# Patient Record
Sex: Female | Born: 1960 | Race: White | Hispanic: No | Smoking: Never smoker
Health system: Southern US, Community
[De-identification: ages and names within clinical notes are randomized; demographics above are authoritative.]

## PROBLEM LIST (undated history)

## (undated) DIAGNOSIS — N189 Chronic kidney disease, unspecified: Secondary | ICD-10-CM

## (undated) DIAGNOSIS — A6000 Herpesviral infection of urogenital system, unspecified: Secondary | ICD-10-CM

## (undated) DIAGNOSIS — C801 Malignant (primary) neoplasm, unspecified: Secondary | ICD-10-CM

## (undated) DIAGNOSIS — Z9289 Personal history of other medical treatment: Secondary | ICD-10-CM

## (undated) HISTORY — DX: Herpesviral infection of urogenital system, unspecified: A60.00

## (undated) HISTORY — PX: SKIN CANCER EXCISION: SHX779

## (undated) HISTORY — PX: BREAST BIOPSY: SHX20

## (undated) HISTORY — DX: Personal history of other medical treatment: Z92.89

## (undated) HISTORY — PX: NASAL FRACTURE SURGERY: SHX718

## (undated) HISTORY — DX: Chronic kidney disease, unspecified: N18.9

## (undated) HISTORY — DX: Malignant (primary) neoplasm, unspecified: C80.1

## (undated) HISTORY — PX: WISDOM TOOTH EXTRACTION: SHX21

## (undated) HISTORY — PX: COLONOSCOPY: SHX174

---

## 1999-04-28 ENCOUNTER — Other Ambulatory Visit: Admission: RE | Admit: 1999-04-28 | Discharge: 1999-04-28 | Payer: Self-pay | Admitting: Obstetrics and Gynecology

## 2001-05-15 ENCOUNTER — Other Ambulatory Visit: Admission: RE | Admit: 2001-05-15 | Discharge: 2001-05-15 | Payer: Self-pay | Admitting: Obstetrics and Gynecology

## 2002-05-21 ENCOUNTER — Other Ambulatory Visit: Admission: RE | Admit: 2002-05-21 | Discharge: 2002-05-21 | Payer: Self-pay | Admitting: Obstetrics and Gynecology

## 2003-05-27 ENCOUNTER — Other Ambulatory Visit: Admission: RE | Admit: 2003-05-27 | Discharge: 2003-05-27 | Payer: Self-pay | Admitting: Obstetrics and Gynecology

## 2004-07-06 ENCOUNTER — Other Ambulatory Visit: Admission: RE | Admit: 2004-07-06 | Discharge: 2004-07-06 | Payer: Self-pay | Admitting: Obstetrics and Gynecology

## 2005-11-25 ENCOUNTER — Encounter: Admission: RE | Admit: 2005-11-25 | Discharge: 2005-11-25 | Payer: Self-pay | Admitting: Family Medicine

## 2007-01-18 ENCOUNTER — Encounter: Admission: RE | Admit: 2007-01-18 | Discharge: 2007-01-18 | Payer: Self-pay | Admitting: Obstetrics and Gynecology

## 2008-06-17 ENCOUNTER — Ambulatory Visit: Payer: Self-pay | Admitting: Gastroenterology

## 2008-06-17 DIAGNOSIS — K625 Hemorrhage of anus and rectum: Secondary | ICD-10-CM

## 2008-06-19 ENCOUNTER — Ambulatory Visit: Payer: Self-pay | Admitting: Gastroenterology

## 2008-06-26 ENCOUNTER — Encounter: Admission: RE | Admit: 2008-06-26 | Discharge: 2008-06-26 | Payer: Self-pay | Admitting: Obstetrics and Gynecology

## 2008-07-02 ENCOUNTER — Encounter: Admission: RE | Admit: 2008-07-02 | Discharge: 2008-07-02 | Payer: Self-pay | Admitting: Obstetrics and Gynecology

## 2009-01-05 ENCOUNTER — Encounter: Admission: RE | Admit: 2009-01-05 | Discharge: 2009-01-05 | Payer: Self-pay | Admitting: Obstetrics and Gynecology

## 2009-07-23 IMAGING — MG MM SCREEN MAMMOGRAM BILATERAL
4 series · 4 of 4 positions shown · non-contrast
Comparison: none

DG SCREEN MAMMOGRAM BILATERAL
Bilateral CC and MLO view(s) were taken.

DIGITAL SCREENING MAMMOGRAM WITH CAD:
There are scattered fibroglandular densities.  Microcalcifications are present in the right breast.
Characterization with magnification views is recommended.  No mass or malignant type 
calcifications are identified in the left breast.  Compared with prior studies.

[R CC]
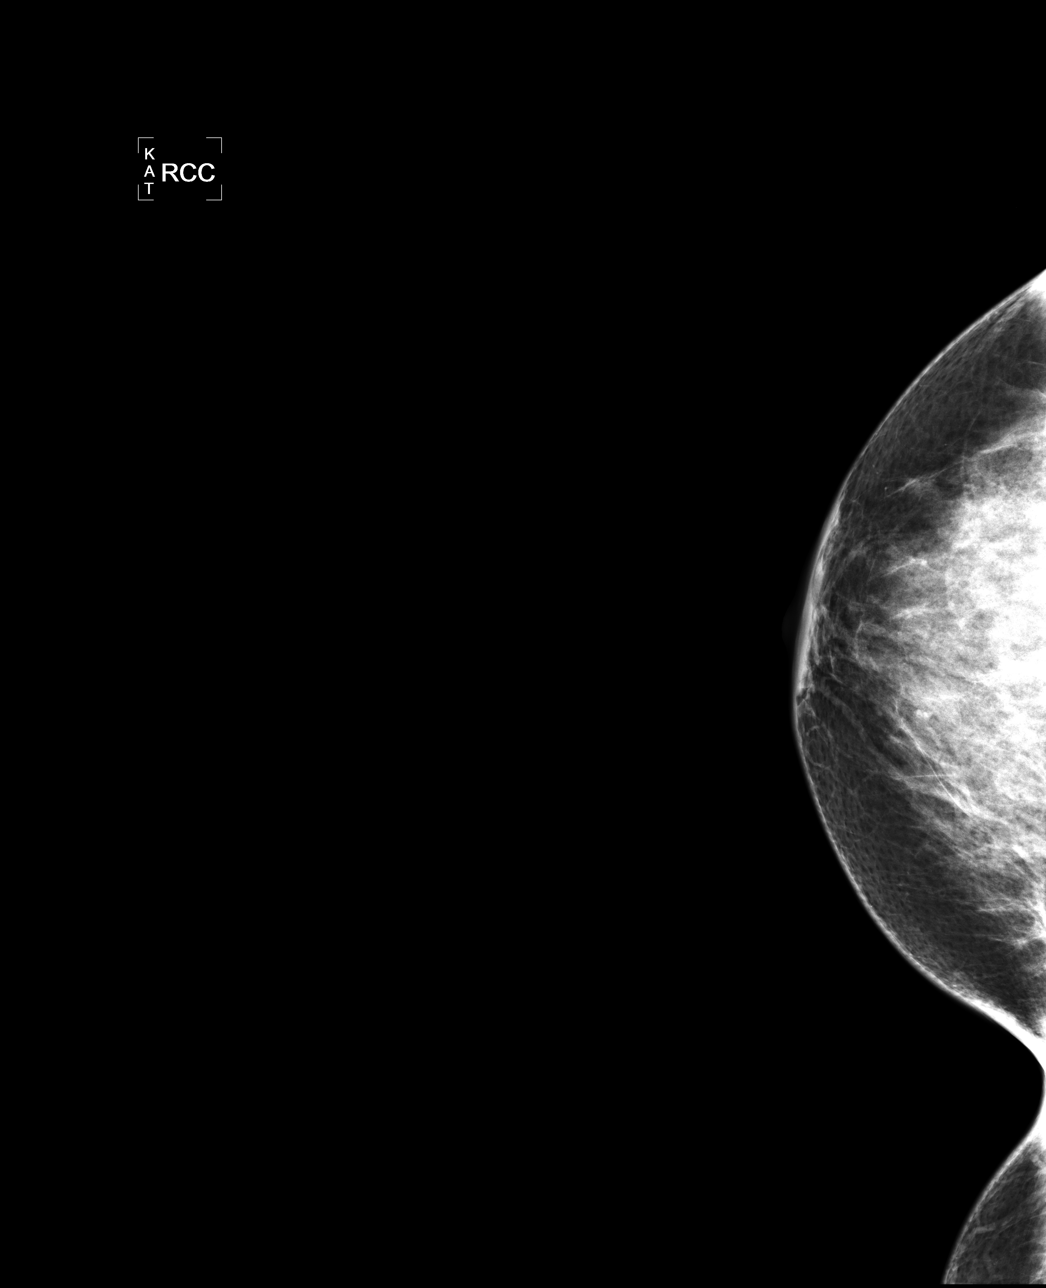

[L CC]
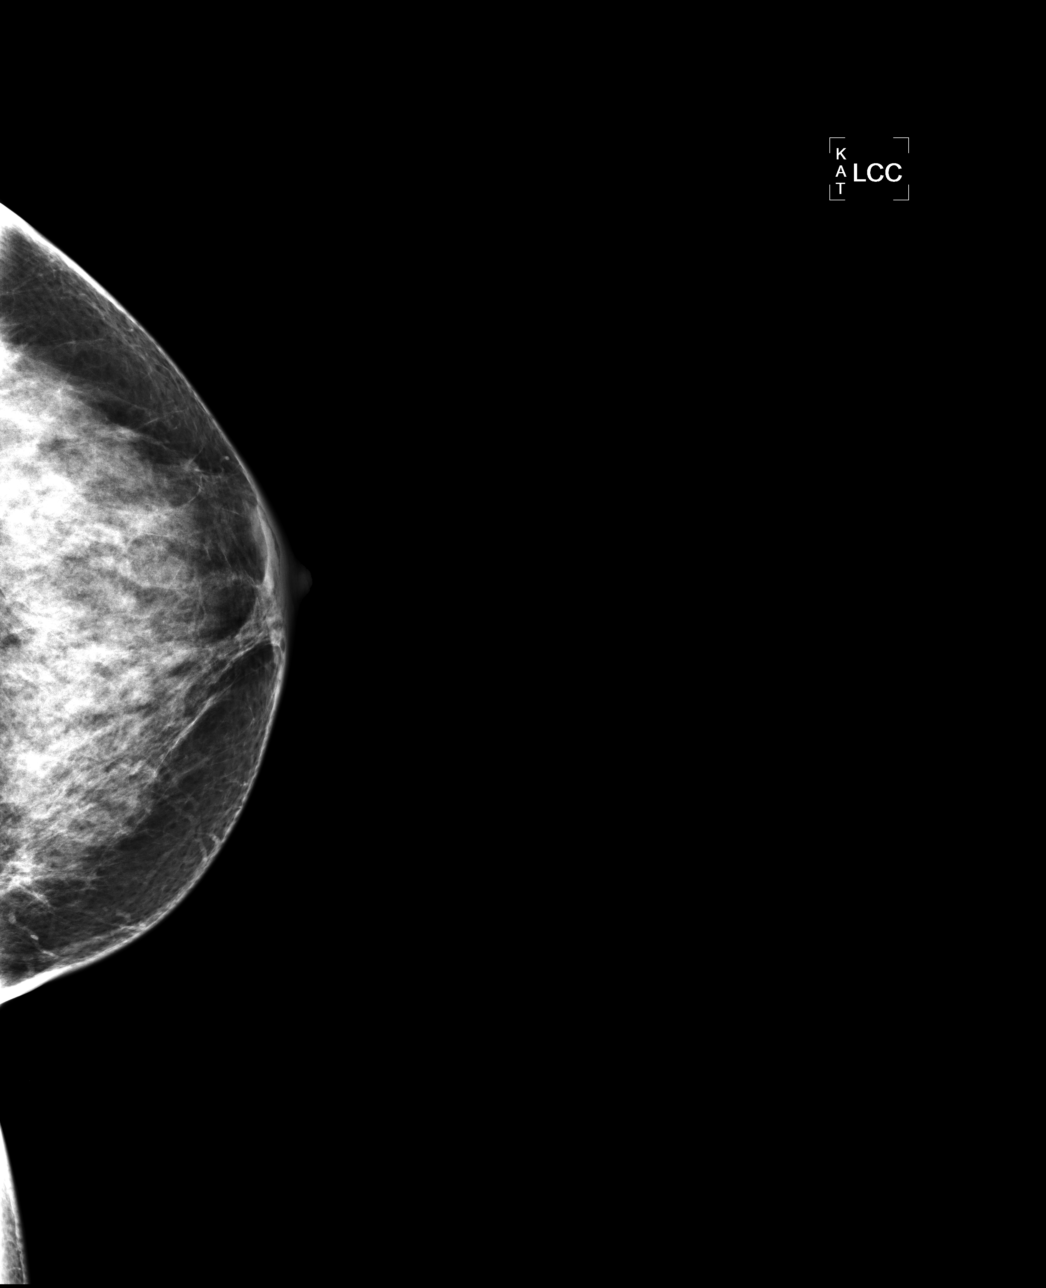

[L MLO]
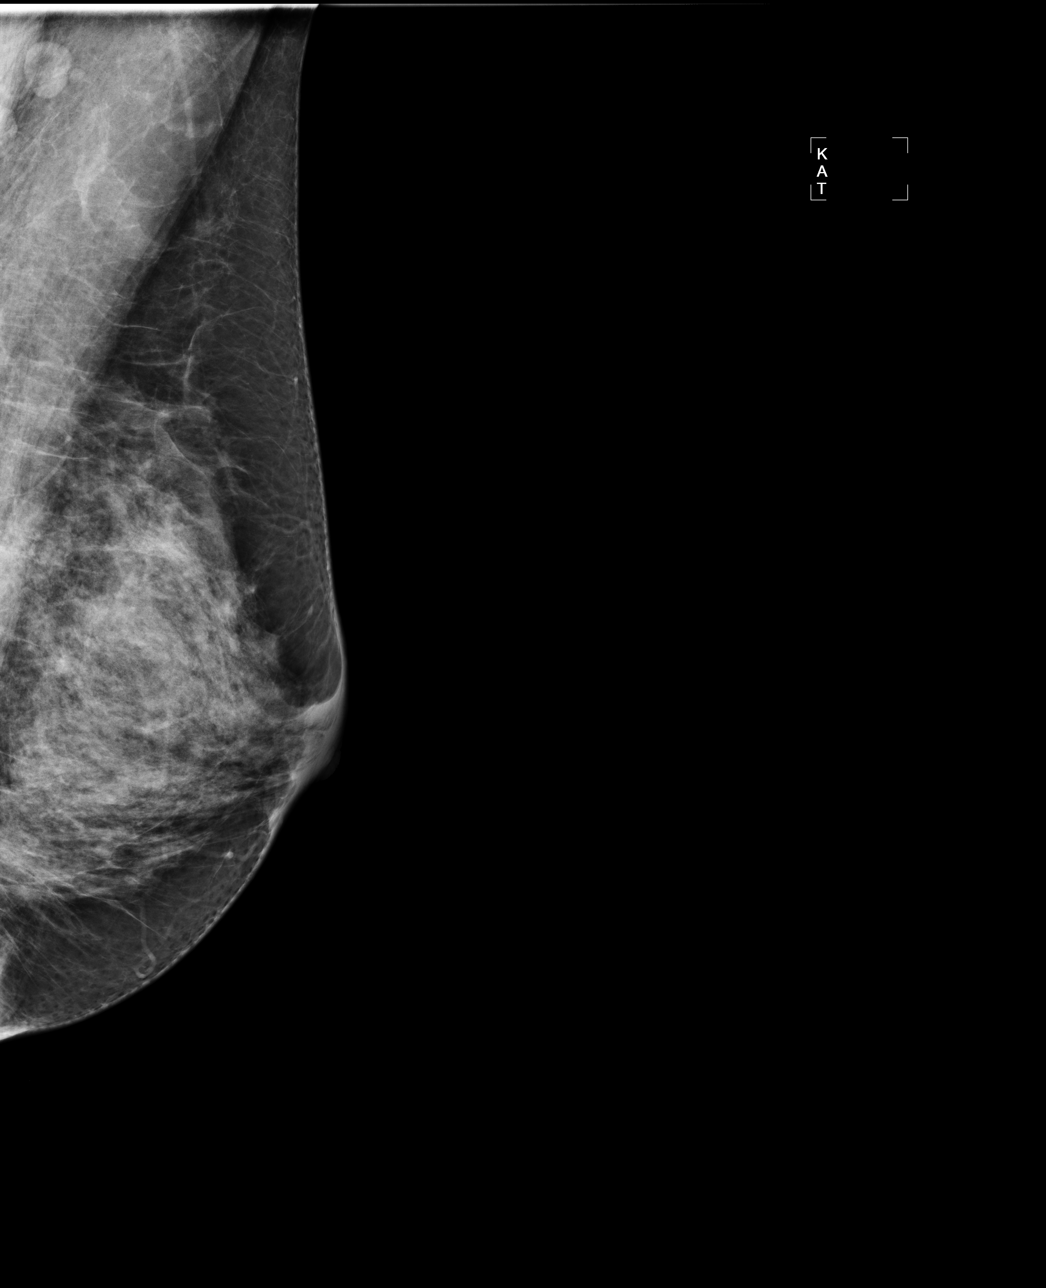

[R MLO]
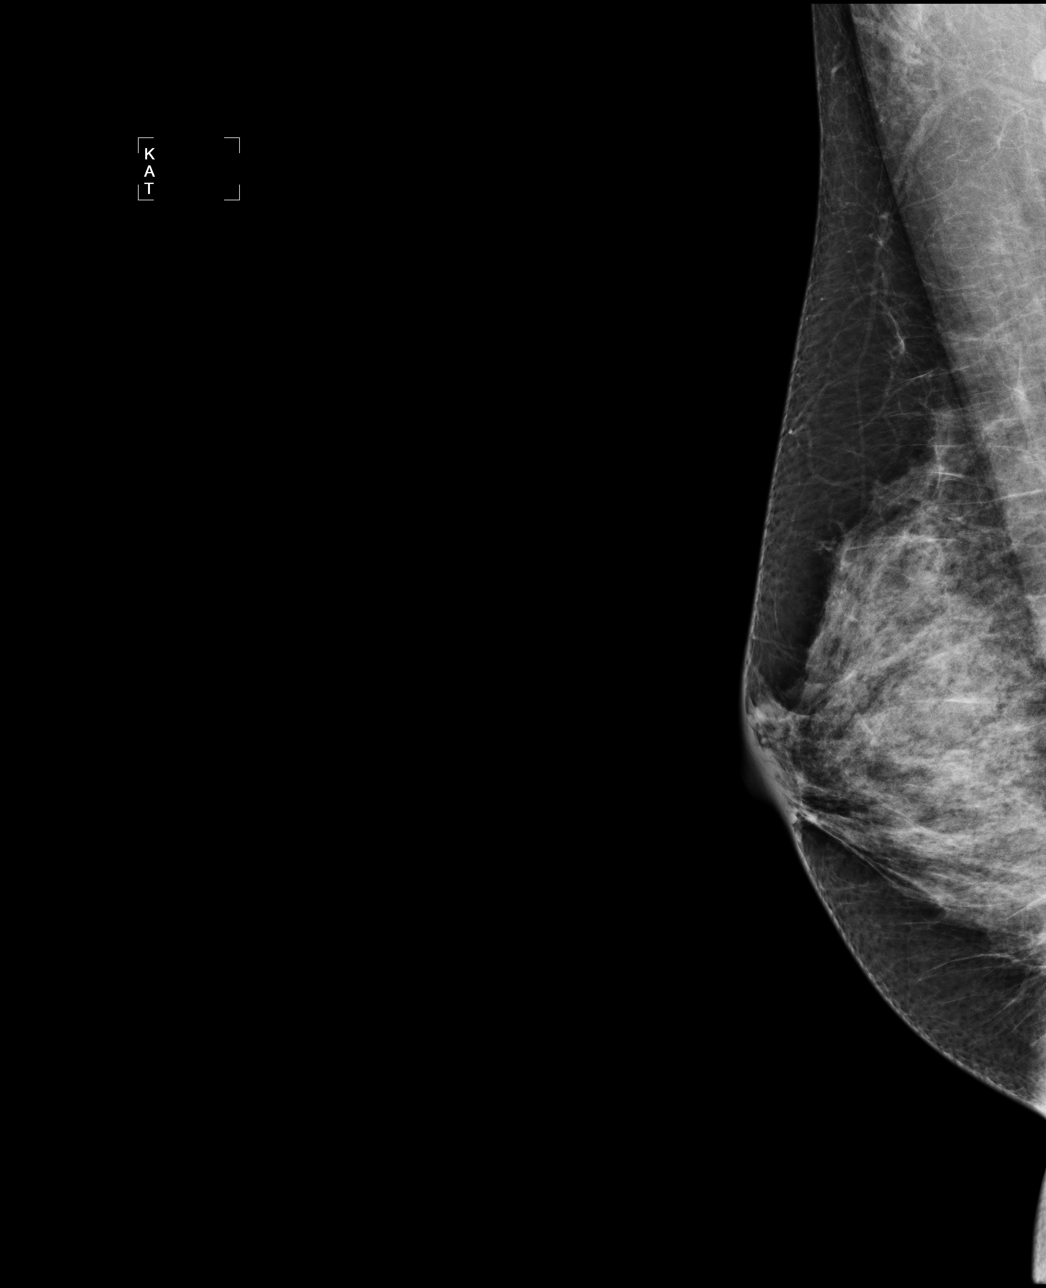

[4 of 4 positions shown; findings below may reference images not displayed]

IMPRESSION: Calcifications, right breast.  Additional evaluation is indicated. The patient will be contacted 
for additional studies and a supplementary report will follow.  No specific mammographic evidence 
of malignancy, left breast.

ASSESSMENT: Need additional imaging evaluation and/or prior mammograms for comparison - BI-RADS 0

Further imaging of the right breast.
ANALYZED BY COMPUTER AIDED DETECTION. , THIS PROCEDURE WAS A DIGITAL MAMMOGRAM.

## 2012-10-29 ENCOUNTER — Other Ambulatory Visit: Payer: Self-pay | Admitting: Obstetrics and Gynecology

## 2012-11-21 ENCOUNTER — Ambulatory Visit
Admission: RE | Admit: 2012-11-21 | Discharge: 2012-11-21 | Disposition: A | Discharge status: Home / Self Care | Payer: BC Managed Care – PPO | Source: Ambulatory Visit | Attending: Obstetrics and Gynecology | Admitting: Obstetrics and Gynecology

## 2012-11-21 DIAGNOSIS — R921 Mammographic calcification found on diagnostic imaging of breast: Secondary | ICD-10-CM

## 2015-02-24 ENCOUNTER — Encounter: Payer: Self-pay | Admitting: Gastroenterology

## 2015-09-10 ENCOUNTER — Ambulatory Visit: Payer: BLUE CROSS/BLUE SHIELD | Admitting: Physical Therapy

## 2015-09-14 ENCOUNTER — Encounter: Payer: Self-pay | Admitting: Rehabilitation

## 2015-09-14 ENCOUNTER — Ambulatory Visit: Payer: BLUE CROSS/BLUE SHIELD | Attending: Orthopedic Surgery | Admitting: Rehabilitation

## 2015-09-14 DIAGNOSIS — M25612 Stiffness of left shoulder, not elsewhere classified: Secondary | ICD-10-CM

## 2015-09-14 DIAGNOSIS — M25512 Pain in left shoulder: Secondary | ICD-10-CM | POA: Diagnosis present

## 2015-09-14 NOTE — Therapy (Signed)
[facility] [address]  [place], [place], [address] Phone: [phone]   Fax:  [phone]  Physical Therapy Evaluation  Patient Details  Name: Brooke Snyder MRN: [id number] Date of Birth: 08-Dec-1960 Referring Provider: Jenetta Loges  Encounter Date: 09/14/2015      PT End of Session - 09/14/15 1705    Visit Number 1   Number of Visits 10   Date for PT Re-Evaluation 10/19/15   PT Start Time [id number]   PT Stop Time [id number]   PT Time Calculation (min) 40 min   Activity Tolerance Patient tolerated treatment well      History reviewed. No pertinent past medical history.  History reviewed. No pertinent past surgical history.  There were no vitals filed for this visit.  Visit Diagnosis:  Shoulder stiffness, left - Plan: PT plan of care cert/re-cert  Pain in joint of left shoulder - Plan: PT plan of care cert/re-cert      Subjective Assessment - 09/14/15 1541    Subjective Pt presents with L shoulder adhesive capsulitis since August.  Had a cortisone shot about 1 week ago which decreased the pain and has improved the ROM.  Also is seeing improvements from a rolfing session.  now seeing alot of improvement.  Difficulty putting bra on, and reaching far overhead.  Not very painful now after shot.  R handed.     Patient Stated Goals get motion back   Currently in Pain? No/denies   Pain Orientation Left   Aggravating Factors  can get a twinge of pain with an incorrect movement   Pain Relieving Factors rest, injection            [facility] PT Assessment - 09/14/15 0001    Assessment   Medical Diagnosis L adhesive capsulitis   Referring Provider Jenetta Loges   Onset Date/Surgical Date 03/30/15   Hand Dominance Right   Next MD Visit 09/24/15   Prior Therapy no   Precautions   Precautions None   Restrictions   Weight Bearing Restrictions No   Balance Screen   Has the patient fallen in the past 6 months No   Prior  Function   Level of Independence Independent   Vocation Full time employment   Vocation Requirements desk work   Observation/Other Assessments   Focus on Therapeutic Outcomes (FOTO)  52% limited   Sensation   Additional Comments 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 for no known reason   ROM / Strength   AROM / PROM / Strength Strength;AROM;PROM   AROM   AROM Assessment Site Shoulder   Right/Left Shoulder Left   Left Shoulder Flexion 150 Degrees  pain   Left Shoulder ABduction 130 Degrees  pain   Left Shoulder Internal Rotation --  to L5 with pain compared to T8 on R   Left Shoulder External Rotation --  80% with pain   PROM   PROM Assessment Site Shoulder   Right/Left Shoulder Left   Left Shoulder Flexion 145 Degrees  pain   Left Shoulder ABduction 90 Degrees  pain   Left Shoulder Internal Rotation 60 Degrees  pain   Left Shoulder External Rotation 35 Degrees  pain   Strength   Strength Assessment Site Shoulder   Right/Left Shoulder Left   Left Shoulder Flexion 4/5   Left Shoulder Extension 4+/5   Left Shoulder ABduction 4-/5   Left Shoulder Internal Rotation 4-/5   Left Shoulder External Rotation 4-/5  Palpation   Palpation comment scapular mobility only slightly decreased.  McNary assessment: inferior glide graded 2/6 with pain;                            PT Education - 09/14/15 1704    Education provided Yes   Education Details Diagnosis, POC, HEP   Person(s) Educated Patient   Methods Explanation;Handout;Verbal cues;Demonstration   Comprehension Verbalized understanding;Returned demonstration          PT Short Term Goals - 09/14/15 1714    PT SHORT TERM GOAL #1   Title Pt will be independent with initial HEP    Time 1   Period Weeks   Status New           PT Long Term Goals - 09/14/15 1714    PT LONG TERM GOAL #1   Title pt will be independent with final HEP   Time 5   Period Weeks   Status New   PT LONG TERM GOAL  #2   Title pt will improve passive shoulder Abduction and ER to [facility] without increased pain   Time 5   Period Weeks   PT LONG TERM GOAL #3   Title pt will report being able to don bra without restrictions   Time 5   Period Weeks   Status New   PT LONG TERM GOAL #4   Title pt will improve L shoulder strength to at least 4/5   Time 5   Period Weeks   Status New               Plan - 09/14/15 1705    Clinical Impression Statement Pt presents with L shoulder adhesive capsulitis since August.  Pt received a cortisone injection and rolfting massage session last week which has almost eliminated her pain.  She continues to have pain and decreased ROM into all planes with substitution into active abduction and ER.  Ingram inferior stiffness at 2/6 and with pain. Weakness also present into abduction, ER, and IR.  Limitations include donning her bra and reaching end range in all directions.  She reports no significant daily limitations.  Pt would like to improve ROM.     Pt will benefit from skilled therapeutic intervention in order to improve on the following deficits Decreased mobility;Decreased range of motion;Decreased strength;Hypomobility;Impaired UE functional use;Pain   Rehab Potential Excellent   Clinical Impairments Affecting Rehab Potential none   PT Frequency 2x / week   PT Duration Other (comment)  5 weeks   PT Treatment/Interventions Electrical Stimulation;Iontophoresis 4mg /ml Dexamethasone;Moist Heat;Cryotherapy;Therapeutic activities;Therapeutic exercise;Manual techniques;Passive range of motion;Taping;Vasopneumatic Device   PT Next Visit Plan L shoulder ROM, manual work to include inferior glides and PROM, possibly scapular mobs, progress HEP   Consulted and Agree with Plan of Care Patient         Problem List Patient Active Problem List   Diagnosis Date Noted  . HEMORRHAGE OF RECTUM AND ANUS 06/17/2008    Stark Bray, DPT, CMP 09/14/2015, 5:21 PM  [facility] [place], [place], [address] Phone: [phone]   Fax:  [phone]  Name: Brooke Snyder MRN: [id number] Date of Birth: May 06, 1961

## 2015-09-16 ENCOUNTER — Ambulatory Visit: Payer: BLUE CROSS/BLUE SHIELD | Admitting: Physical Therapy

## 2015-09-16 DIAGNOSIS — M25512 Pain in left shoulder: Secondary | ICD-10-CM

## 2015-09-16 DIAGNOSIS — M25612 Stiffness of left shoulder, not elsewhere classified: Secondary | ICD-10-CM | POA: Diagnosis not present

## 2015-09-16 NOTE — Therapy (Signed)
[facility] [address]  [place], [place], [address] Phone: [phone]   Fax:  [phone]  Physical Therapy Treatment  Patient Details  Name: Brooke Snyder MRN: [id number] Date of Birth: 01/06/1961 Referring Provider: Jenetta Loges  Encounter Date: 09/16/2015      PT End of Session - 09/16/15 1319    Visit Number 2   Number of Visits 10   Date for PT Re-Evaluation 10/19/15   PT Start Time [id number]   PT Stop Time 1403   PT Time Calculation (min) 45 min      No past medical history on file.  No past surgical history on file.  There were no vitals filed for this visit.  Visit Diagnosis:  Shoulder stiffness, left  Pain in joint of left shoulder      Subjective Assessment - 09/16/15 1319    Subjective States is typically pain-free but pain with HEP which limits her tolerance right now but is performing.   Currently in Pain? Yes  no pain at rest, pain with stretching.   Pain Score --  pain-free at rest, 5/10 with stretching today      TODAY'S TREATMENT Manual - L GH grade 3 AP and Caudal glides with mobs into IR, ER, and Flexion; contract/relax into ER at 90 ABD  TherEx - Supine Pullover 4# shoulder flexion 10x Supine L Shoulder ER/IR AROM in 75 ABD with 2# db 10x Supine B Horiz ADD 2# 10x              PT Short Term Goals - 09/16/15 1603    PT SHORT TERM GOAL #1   Title Pt will be independent with initial HEP    Status Achieved           PT Long Term Goals - 09/16/15 1603    PT LONG TERM GOAL #1   Title pt will be independent with final HEP by 10/19/15   Status On-going   PT LONG TERM GOAL #2   Title pt will improve passive shoulder Abduction and ER to [facility] without increased pain by 10/19/15   Status On-going   PT LONG TERM GOAL #3   Title pt will report being able to don bra without restrictions by 10/19/15   Status On-going   PT LONG TERM GOAL #4   Title pt will improve L  shoulder strength to at least 4/5 by 10/19/15   Status On-going               Plan - 09/16/15 1558    Clinical Impression Statement Initial PT treatment well tolerated.  Manual limted by pain but good motivation on behalf of pt allows good amount of pressure.  Exercises following manual very well tolerated and improved mobility noted by pt following treatment.   PT Next Visit Plan L shoulder ROM, manual work to include inferior glides and PROM, possibly scapular mobs, progress HEP   Consulted and Agree with Plan of Care Patient        Problem List Patient Active Problem List   Diagnosis Date Noted  . HEMORRHAGE OF RECTUM AND ANUS 06/17/2008    Howell Groesbeck PT, OCS 09/16/2015, 4:04 PM  [facility] [address]  [place], [place], [address] Phone: [phone]   Fax:  [phone]  Name: Brooke Snyder MRN: [id number] Date of Birth: 1960/08/30

## 2015-09-22 ENCOUNTER — Ambulatory Visit: Payer: BLUE CROSS/BLUE SHIELD | Admitting: Physical Therapy

## 2015-09-22 DIAGNOSIS — M25612 Stiffness of left shoulder, not elsewhere classified: Secondary | ICD-10-CM

## 2015-09-22 DIAGNOSIS — M25512 Pain in left shoulder: Secondary | ICD-10-CM

## 2015-09-22 NOTE — Therapy (Signed)
[facility] [address]  [place], [place], [address] Phone: [phone]   Fax:  [phone]  Physical Therapy Treatment  Patient Details  Name: Roshawna Tewes MRN: [id number] Date of Birth: [birth year] Referring Provider: Jenetta Loges  Encounter Date: 09/22/2015      PT End of Session - 09/22/15 1108    Visit Number 3   Number of Visits 10   Date for PT Re-Evaluation 10/19/15   PT Start Time 1100   PT Stop Time 1144   PT Time Calculation (min) 44 min   Activity Tolerance Patient tolerated treatment well   Behavior During Therapy [facility] for tasks assessed/performed      No past medical history on file.  No past surgical history on file.  There were no vitals filed for this visit.  Visit Diagnosis:  Shoulder stiffness, left  Pain in joint of left shoulder      Subjective Assessment - 09/22/15 1102    Subjective Patient reports she had a second accupuncture session on Friday with feeling of increased mobility noted but reported some nausea at the time of the treatment and mild increased soreness since. Has started trying to put bra on the "natural way" but still expereinces some discomfort with this.   Currently in Pain? No/denies   Pain Score 0-No pain  pain wtih certain movements but stops if pain get up to 3-4/10   Pain Location Shoulder   Pain Orientation Left  deep in joint          TODAY'S TREATMENT  Manual  L GH grade 3 AP and Caudal glides with mobs into IR, ER, Flexion and Abduction; contract/relax into ER at 90 ABD (limited tolerance with Abduction)  TherEx Supine Pullover 4# shoulder flexion 2x10 Supine Serratus punch 2# x10 Supine Shoulder circles at 90 dg flexion CW/CCW x10 each Supine L Shoulder ER/IR AROM in 75 ABD with 2# db 10x Supine B Horiz ADD 2# 2x10  R sidelying L shoulder ER x10 with PT guided MWM R sidelying L shoulder Abd in limited range 20-70dg x10 with PT guided  MWM          PT Short Term Goals - 09/16/15 1603    PT SHORT TERM GOAL #1   Title Pt will be independent with initial HEP    Status Achieved           PT Long Term Goals - 09/22/15 1159    PT LONG TERM GOAL #1   Title pt will be independent with final HEP by 10/19/15   Status On-going   PT LONG TERM GOAL #2   Title pt will improve passive shoulder Abduction and ER to [facility] without increased pain by 10/19/15   Status On-going   PT LONG TERM GOAL #3   Title pt will report being able to don bra without restrictions by 10/19/15   Status On-going   PT LONG TERM GOAL #4   Title pt will improve L shoulder strength to at least 4/5 by 10/19/15   Status On-going               Plan - 09/22/15 1154    Clinical Impression Statement Patient arrived to therapy with hot pack on shoulder to warm-up muscles prior to manual therapy and exercises. Patient tolerating manual therapy with pain level remaining </= 5/10. MWM utilized during ROM/strengthening exercises with increased ROM and decreased pain noted. Patient reporting improved flexibility/mobility after treatment.  PT Next Visit Plan L shoulder ROM, manual work to include inferior glides and PROM, possibly scapular mobs, progress HEP   Consulted and Agree with Plan of Care Patient        Problem List Patient Active Problem List   Diagnosis Date Noted  . HEMORRHAGE OF RECTUM AND ANUS 06/17/2008    Percival Spanish, PT, MPT 09/22/2015, 12:02 PM  [facility] [address]  [place], [place], [address] Phone: [phone]   Fax:  [phone]  Name: Krystn Solano MRN: [id number] Date of Birth: Nov 25, 1960

## 2015-09-25 ENCOUNTER — Ambulatory Visit: Payer: BLUE CROSS/BLUE SHIELD | Admitting: Physical Therapy

## 2015-09-25 DIAGNOSIS — M25612 Stiffness of left shoulder, not elsewhere classified: Secondary | ICD-10-CM | POA: Diagnosis not present

## 2015-09-25 DIAGNOSIS — M25512 Pain in left shoulder: Secondary | ICD-10-CM

## 2015-09-25 NOTE — Therapy (Signed)
[facility] [address]  [place], [place], [address] Phone: [phone]   Fax:  [phone]  Physical Therapy Treatment  Patient Details  Name: Brooke Snyder MRN: [id number] Date of Birth: 03/28/1961 Referring Provider: Jenetta Loges  Encounter Date: 09/25/2015      PT End of Session - 09/25/15 1106    Visit Number 4   Number of Visits 10   Date for PT Re-Evaluation 10/19/15   PT Start Time 1100   PT Stop Time 1159   PT Time Calculation (min) 59 min   Activity Tolerance Patient tolerated treatment well   Behavior During Therapy [facility] for tasks assessed/performed      No past medical history on file.  No past surgical history on file.  There were no vitals filed for this visit.  Visit Diagnosis:  Shoulder stiffness, left  Pain in joint of left shoulder      Subjective Assessment - 09/25/15 1102    Subjective Patient stating her shoulder has been a little sorer since yesterday, but unable to identify triggering cause. Had to resort to alternative method of donnin bra today due to soreness.   Currently in Pain? Yes   Pain Score --  1-2   Pain Location Shoulder   Pain Orientation Left   Pain Descriptors / Indicators Sore            [facility] PT Assessment - 09/25/15 1100    Assessment   Medical Diagnosis L adhesive capsulitis   Next MD Visit 09/29/15   ROM / Strength   AROM / PROM / Strength AROM;PROM;Strength   AROM   AROM Assessment Site Shoulder   Right/Left Shoulder Left   Left Shoulder Flexion 160 Degrees   Left Shoulder ABduction 148 Degrees   Left Shoulder Internal Rotation --  L1 with pain   Left Shoulder External Rotation --  90%   PROM   Overall PROM Comments Pain present at end range with all movements, but worse with ER & IR   PROM Assessment Site Shoulder   Right/Left Shoulder Left   Left Shoulder Flexion 159 Degrees   Left Shoulder ABduction 131 Degrees   Left Shoulder  Internal Rotation 77 Degrees   Left Shoulder External Rotation 57 Degrees   Strength   Strength Assessment Site Shoulder   Right/Left Shoulder Left   Left Shoulder Flexion 4+/5   Left Shoulder Extension 4+/5   Left Shoulder ABduction 4/5   Left Shoulder Internal Rotation 4/5   Left Shoulder External Rotation 4-/5          TODAY'S TREATMENT  Manual  L GH grade 3 AP and Caudal glides with mobs into IR, ER, Flexion and Abduction; contract/relax into ER at 90 ABD (limited tolerance with Abduction)  TherEx Supine Pullover 5# shoulder flexion 2x15 Supine Serratus punch 2# x15 Supine Shoulder circles at 90 dg flexion CW/CCW 2# x15 each Supine L Shoulder ER/IR AROM in 75 ABD with 2# db x15 Supine B Horiz ADD 2# 2x10    Ice pack to left shoulder x12'          PT Short Term Goals - 09/16/15 1603    PT SHORT TERM GOAL #1   Title Pt will be independent with initial HEP    Status Achieved           PT Long Term Goals - 09/25/15 1143    PT LONG TERM GOAL #1   Title pt will be independent  with final HEP by 10/19/15   Status On-going   PT LONG TERM GOAL #2   Title pt will improve passive shoulder Abduction and ER to [facility] without increased pain by 10/19/15   Status On-going   PT LONG TERM GOAL #3   Title pt will report being able to don bra without restrictions by 10/19/15   Status On-going   PT LONG TERM GOAL #4   Title pt will improve L shoulder strength to at least 4/5 by 10/19/15   Status On-going               Plan - 09/25/15 1151    Clinical Impression Statement Patient noting mild increased soreness starting yesterday without known trigger with decreased tolerance for ER/IR mobs and stretches today. Overall demonstrating good initial progress with improving active and passive ROM in all planes along with increased strength in all motions except ER. Remains limted in functional use of L UE especially with motions behind her back such as donning bra and will  benefit from continued PT to maximize functional ROM and strength.   PT Next Visit Plan L shoulder ROM, manual work to include inferior glides and PROM, possibly scapular mobs, progress HEP   Consulted and Agree with Plan of Care Patient        Problem List Patient Active Problem List   Diagnosis Date Noted  . HEMORRHAGE OF RECTUM AND ANUS 06/17/2008    Percival Spanish, PT, MPT 09/25/2015, 11:57 AM  [facility] [address]  [place], [place], [address] Phone: [phone]   Fax:  [phone]  Name: Brooke Snyder MRN: [id number] Date of Birth: 10/21/1960

## 2015-09-28 ENCOUNTER — Encounter: Payer: Self-pay | Admitting: Rehabilitation

## 2015-09-28 ENCOUNTER — Ambulatory Visit: Payer: BLUE CROSS/BLUE SHIELD | Admitting: Rehabilitation

## 2015-09-28 DIAGNOSIS — M25612 Stiffness of left shoulder, not elsewhere classified: Secondary | ICD-10-CM

## 2015-09-28 DIAGNOSIS — M25512 Pain in left shoulder: Secondary | ICD-10-CM

## 2015-09-28 NOTE — Therapy (Signed)
[facility] [address]  [place], [place], [address] Phone: [phone]   Fax:  [phone]  Physical Therapy Treatment  Patient Details  Name: Brooke Snyder MRN: [id number] Date of Birth: Jan 16, 1961 Referring Provider: Jenetta Loges  Encounter Date: 09/28/2015      PT End of Session - 09/28/15 0843    Visit Number 5   Number of Visits 10   Date for PT Re-Evaluation 10/19/15   PT Start Time 0800   PT Stop Time 0859   PT Time Calculation (min) 59 min   Activity Tolerance Patient tolerated treatment well      History reviewed. No pertinent past medical history.  History reviewed. No pertinent past surgical history.  There were no vitals filed for this visit.  Visit Diagnosis:  Shoulder stiffness, left  Pain in joint of left shoulder      Subjective Assessment - 09/28/15 0804    Subjective Getting better.  Still sore this morning.     Currently in Pain? Yes   Pain Score 1    Pain Location Shoulder   Pain Orientation Left   Pain Descriptors / Indicators Aching   Aggravating Factors  reaching behind the back and ER/ABD motions   Pain Relieving Factors rest        TODAY'S TREATMENT  Manual  L GH grade 3 AP and Caudal glides with mobs into IR, ER, Flexion and Abduction; contract/relax into ER at 90 ABD (limited tolerance with Abduction)  TherEx UBE 90"/90" level 1.5 Pulleys flex and abduction x 48min each BATCA pulldown 10# more for ROM x 10 Prone horizontal abduction 2# with manual scapular assist 2x 10 Prone 2# extension x 15 Prone stool roll from abduction to flexion x 10 Supine Pullover 3# shoulder flexion x10     Declines ice now due to accupuncture suggestion  MHx32min L shoulder                             PT Short Term Goals - 09/16/15 1603    PT SHORT TERM GOAL #1   Title Pt will be independent with initial HEP    Status Achieved           PT Long  Term Goals - 09/25/15 1143    PT LONG TERM GOAL #1   Title pt will be independent with final HEP by 10/19/15   Status On-going   PT LONG TERM GOAL #2   Title pt will improve passive shoulder Abduction and ER to [facility] without increased pain by 10/19/15   Status On-going   PT LONG TERM GOAL #3   Title pt will report being able to don bra without restrictions by 10/19/15   Status On-going   PT LONG TERM GOAL #4   Title pt will improve L shoulder strength to at least 4/5 by 10/19/15   Status On-going               Plan - 09/28/15 0844    Clinical Impression Statement tolerated all well.  weakness evident with prone scapular retraction work into horizontal abduction and still with limitations mainly into ER;  MD visit tomorrow   PT Next Visit Plan L shoulder ROM, manual work to include inferior glides and PROM, possibly scapular mobs, progress HEP        Problem List Patient Active Problem List   Diagnosis Date Noted  . HEMORRHAGE OF  RECTUM AND ANUS 06/17/2008    Stark Bray, DPT, CMP 09/28/2015, 8:46 AM  [facility] [place], [place], [address] Phone: [phone]   Fax:  [phone]  Name: Brooke Snyder MRN: [id number] Date of Birth: [birth year]

## 2015-09-29 ENCOUNTER — Ambulatory Visit: Payer: BLUE CROSS/BLUE SHIELD | Admitting: Physical Therapy

## 2015-10-02 ENCOUNTER — Ambulatory Visit: Payer: BLUE CROSS/BLUE SHIELD | Attending: Orthopedic Surgery | Admitting: Physical Therapy

## 2015-10-02 DIAGNOSIS — M25512 Pain in left shoulder: Secondary | ICD-10-CM | POA: Diagnosis present

## 2015-10-02 DIAGNOSIS — M25612 Stiffness of left shoulder, not elsewhere classified: Secondary | ICD-10-CM | POA: Diagnosis not present

## 2015-10-02 NOTE — Therapy (Signed)
[facility] [address]  [place], [place], [address] Phone: [phone]   Fax:  [phone]  Physical Therapy Treatment  Patient Details  Name: Brooke Snyder MRN: [id number] Date of Birth: 1961-07-23 Referring Provider: Jenetta Loges  Encounter Date: 10/02/2015      PT End of Session - 10/02/15 1118    Visit Number 6   Number of Visits 10   PT Start Time [id number]  Pt arrived late   PT Stop Time 1154   PT Time Calculation (min) 38 min   Activity Tolerance Patient tolerated treatment well   Behavior During Therapy [facility] for tasks assessed/performed      No past medical history on file.  No past surgical history on file.  There were no vitals filed for this visit.  Visit Diagnosis:  Shoulder stiffness, left  Pain in joint of left shoulder      Subjective Assessment - 10/02/15 1120    Subjective Saw MD who was pleased with progress and has released her. Pain typically states pain no greater than 1/10 unless she moves the wrong way where she will get a brief sharp pain.   Currently in Pain? Yes   Pain Score 1    Pain Location Shoulder           TODAY'S TREATMENT  TherEx UBE 90"/90" level 1.5 Pulleys flex and abduction x 22min each  Manual  L GH grade 3 AP and Caudal glides with mobs into IR, ER, Flexion and Abduction; contract/relax into ER at 90 ABD (limited tolerance with Abduction) L Scapular mobs - Protraction/retraction & rotational mobs  R Side-lying L Shoudler Abd with manual scapular assist x15  R Side-lying L ER with manual scapular assist x15  Prone L Shoulder extension to neutral 2# x15 Prone L Shoulder horizontal abduction with manual scapular assist 2x10 (added weight deferred due to poor mechanics) Prone stool roll from abduction to flexion x 10           PT Short Term Goals - 09/16/15 1603    PT SHORT TERM GOAL #1   Title Pt will be independent with initial HEP    Status  Achieved           PT Long Term Goals - 10/02/15 1202    PT LONG TERM GOAL #1   Title pt will be independent with final HEP by 10/19/15   Status On-going   PT LONG TERM GOAL #2   Title pt will improve passive shoulder Abduction and ER to [facility] without increased pain by 10/19/15   Status On-going   PT LONG TERM GOAL #3   Title pt will report being able to don bra without restrictions by 10/19/15   Status On-going   PT LONG TERM GOAL #4   Title pt will improve L shoulder strength to at least 4/5 by 10/19/15   Status On-going               Plan - 10/02/15 1155    Clinical Impression Statement Patient reports MD pleased with progress and left continued PT to patient's preference. Patient still feels limited with functional use of left UE and would like to finish out existing POC, then re-evaluate readiness for continuation with HEP vs recert. Patient able to achieve increased shoulder abduction and ER ROM after scapular mobs and with manual guidance of scapular motion during shoulder ROM and reports shoulder feels looser after therapy today.   PT Next  Visit Plan L shoulder ROM, manual work to include inferior glides and PROM, scapular mobs PRN, progress HEP   Consulted and Agree with Plan of Care Patient        Problem List Patient Active Problem List   Diagnosis Date Noted  . HEMORRHAGE OF RECTUM AND ANUS 06/17/2008    Percival Spanish, PT, MPT 10/02/2015, 12:04 PM  [facility] [address]  [place], [place], [address] Phone: [phone]   Fax:  [phone]  Name: Brooke Snyder MRN: [id number] Date of Birth: February 01, 1961

## 2015-10-06 ENCOUNTER — Ambulatory Visit: Payer: BLUE CROSS/BLUE SHIELD | Admitting: Physical Therapy

## 2015-10-06 DIAGNOSIS — M25612 Stiffness of left shoulder, not elsewhere classified: Secondary | ICD-10-CM

## 2015-10-06 DIAGNOSIS — M25512 Pain in left shoulder: Secondary | ICD-10-CM

## 2015-10-06 NOTE — Therapy (Signed)
[facility] [address]  [place], [place], [address] Phone: [phone]   Fax:  [phone]  Physical Therapy Treatment  Patient Details  Name: Brooke Snyder MRN: [id number] Date of Birth: [birth year] Referring Provider: Jenetta Loges  Encounter Date: 10/06/2015      PT End of Session - 10/06/15 1109    Visit Number 7   Number of Visits 10   Date for PT Re-Evaluation 10/19/15   PT Start Time 1103   PT Stop Time 1149   PT Time Calculation (min) 46 min   Activity Tolerance Patient tolerated treatment well   Behavior During Therapy [facility] for tasks assessed/performed      No past medical history on file.  No past surgical history on file.  There were no vitals filed for this visit.  Visit Diagnosis:  Shoulder stiffness, left  Pain in joint of left shoulder      Subjective Assessment - 10/06/15 1107    Subjective Patient stating "Shoulder is a little freezy today" (more stiff than painful). States still having the most difficulty with removing a jacket. Patient reportinf "good intentions" but limited compliance with HEP.   Currently in Pain? No/denies            [facility] PT Assessment - 10/06/15 1103    ROM / Strength   AROM / PROM / Strength AROM   AROM   AROM Assessment Site Shoulder   Left Shoulder Flexion 164 Degrees   Left Shoulder ABduction 154 Degrees   Left Shoulder Internal Rotation --  T11 with pain   Left Shoulder External Rotation --  90%        TODAY'S TREATMENT  TherEx UBE 90"/90" level 1.5  Manual  L GH grade 3 AP and Caudal glides with mobs into IR, ER, Flexion and Abduction; contract/relax into ER at 90 ABD (limited tolerance with Abduction) L Scapular mobs - Protraction/retraction & rotational mobs  TherEx Supine Pullover 4# shoulder flexion x15 Hooklying B Shoulder Horiz ABD with yellow TB 10x3" Hooklying D2 Flexion 10x3" (repeated corrections for proper pattern on  diagonal) R Side-lying L ER x15  R Side-lying L Shoudler Abd with manual scapular assist 1# x15  Prone L Scapular retraction + Shoulder extension to neutral 2# x15 Prone L Shoulder Row 2# x15 Prone L Shoulder horizontal abduction with manual scapular assist 1# x10  Prone stool roll from abduction to flexion x15         PT Education - 10/06/15 1152    Education Details HEP update   Person(s) Educated Patient   Methods Explanation;Demonstration;Handout   Comprehension Verbalized understanding;Returned demonstration          PT Short Term Goals - 09/16/15 1603    PT SHORT TERM GOAL #1   Title Pt will be independent with initial HEP    Status Achieved           PT Long Term Goals - 10/02/15 1202    PT LONG TERM GOAL #1   Title pt will be independent with final HEP by 10/19/15   Status On-going   PT LONG TERM GOAL #2   Title pt will improve passive shoulder Abduction and ER to [facility] without increased pain by 10/19/15   Status On-going   PT LONG TERM GOAL #3   Title pt will report being able to don bra without restrictions by 10/19/15   Status On-going   PT LONG TERM GOAL #4   Title  pt will improve L shoulder strength to at least 4/5 by 10/19/15   Status On-going               Plan - 10/06/15 1157    Clinical Impression Statement Patient admitting to limited compliance with HEP and noting some increased stiffness today with greatest remaining functional limitation noted with removing a jacket. Shoulder ROM continues to gradually improve per MMT. Introduced more functional movement patterns with patient had difficulty coordinating patterns on diagonals.   PT Next Visit Plan L shoulder ROM, manual work to include inferior glides and PROM, scapular mobs PRN, progress HEP as indicated   Consulted and Agree with Plan of Care Patient        Problem List Patient Active Problem List   Diagnosis Date Noted  . HEMORRHAGE OF RECTUM AND ANUS 06/17/2008    Percival Spanish 10/06/2015, 12:05 PM  [facility] [address]  [place], [place], [address] Phone: [phone]   Fax:  [phone]  Name: Brooke Snyder MRN: [id number] Date of Birth: 1961/06/01

## 2015-10-09 ENCOUNTER — Ambulatory Visit: Payer: BLUE CROSS/BLUE SHIELD | Admitting: Physical Therapy

## 2015-10-09 DIAGNOSIS — M25612 Stiffness of left shoulder, not elsewhere classified: Secondary | ICD-10-CM | POA: Diagnosis not present

## 2015-10-09 DIAGNOSIS — M25512 Pain in left shoulder: Secondary | ICD-10-CM

## 2015-10-09 NOTE — Therapy (Signed)
[facility] [address]  [place], [place], [address] Phone: [phone]   Fax:  [phone]  Physical Therapy Treatment  Patient Details  Name: Brooke Snyder MRN: [id number] Date of Birth: 06/14/1961 Referring Provider: Jenetta Loges  Encounter Date: 10/09/2015      PT End of Session - 10/09/15 1111    Visit Number 8   Number of Visits 10   Date for PT Re-Evaluation 10/19/15   PT Start Time 1107   PT Stop Time 1154   PT Time Calculation (min) 47 min   Activity Tolerance Patient tolerated treatment well   Behavior During Therapy [facility] for tasks assessed/performed      No past medical history on file.  No past surgical history on file.  There were no vitals filed for this visit.  Visit Diagnosis:  Shoulder stiffness, left  Pain in joint of left shoulder      Subjective Assessment - 10/09/15 1110    Subjective Patient reports shoulder was "really sore" (1.5-2/10) yesterday but unable to identify triggering event. Better today.   Currently in Pain? No/denies          TODAY'S TREATMENT  TherEx UBE 90"/90" level 2.0  Manual  L GH grade 3 AP and Caudal glides with mobs into IR, ER, Flexion and Abduction; contract/relax into ER at 90 ABD (limited tolerance with Abduction) L Scapular mobs - Protraction/retraction & rotational mobs  TherEx Hooklying on 1/2 foam roll:   Chest/pec stretch with arms in horiz Abd x1'   B Shoulder Horiz ABD with yellow TB 2x10, 3" hold   B Shoulder flexion Pullover 4# x15   B shoulder ER with yellow TB 10x3"   L shoulder D2 Flexion 15x3"  R Side-lying L ER 1# x15  R Side-lying L Shoudler Abd 1# x15 (with manual scapular assist for first few reps) Prone over Green (65cm) Pball    B Scapular retraction + Shoulder extension to neutral x10, 1# x10   B Shoulder Row x10   B "T" x10   Attempted "Y" but too difficult   Continues to decline ice due to accupuncture  suggestion  MH x 8'  L shoulder           PT Short Term Goals - 09/16/15 1603    PT SHORT TERM GOAL #1   Title Pt will be independent with initial HEP    Status Achieved           PT Long Term Goals - 10/09/15 1228    PT LONG TERM GOAL #1   Title pt will be independent with final HEP by 10/19/15   Status On-going   PT LONG TERM GOAL #2   Title pt will improve passive shoulder Abduction and ER to [facility] without increased pain by 10/19/15   Status On-going   PT LONG TERM GOAL #3   Title pt will report being able to don bra without restrictions by 10/19/15   Status On-going   PT LONG TERM GOAL #4   Title pt will improve L shoulder strength to at least 4/5 by 10/19/15   Status On-going               Plan - 10/09/15 1228    Clinical Impression Statement Patient reporting increased L shoulder pain yesterday without known triggering event, but pain resolved today. Increased emphasis on scapular stabilzation and anterior shoulder/chest stretching as patient persists with tendency for forward shoulder alignment limiting  functional overhead activities. Patient nearing end of current POC and inquiring about continuing PT due to self-reported limited compliance. Educated patient that lack of compliance with HEP would not justify extension of therapy services and that decision to recert vs D/C would be based on function and existence of remaining deficits requiring direct involvement of PT to address.   PT Next Visit Plan Assess ROM/MMT/Goal status and need for HEP update in prep for D/C vs Recert; L shoulder ROM, scapular stabilization, manual work to include inferior glides and PROM PRN, scapular mobs PRN, progress HEP as indicated   Consulted and Agree with Plan of Care Patient        Problem List Patient Active Problem List   Diagnosis Date Noted  . HEMORRHAGE OF RECTUM AND ANUS 06/17/2008    Percival Spanish, PT, MPT 10/09/2015, 12:42 PM  [facility] [address]  [place], [place], [address] Phone: [phone]   Fax:  [phone]  Name: Brooke Snyder MRN: [id number] Date of Birth: 1961-01-27

## 2015-10-13 ENCOUNTER — Ambulatory Visit: Payer: BLUE CROSS/BLUE SHIELD | Admitting: Physical Therapy

## 2015-10-13 DIAGNOSIS — M25612 Stiffness of left shoulder, not elsewhere classified: Secondary | ICD-10-CM

## 2015-10-13 DIAGNOSIS — M25512 Pain in left shoulder: Secondary | ICD-10-CM

## 2015-10-13 NOTE — Therapy (Signed)
[facility] [address]  [place], [place], [address] Phone: [phone]   Fax:  [phone]  Physical Therapy Treatment  Patient Details  Name: Brooke Snyder MRN: [id number] Date of Birth: [birth year] Referring Provider: Jenetta Loges  Encounter Date: 10/13/2015      PT End of Session - 10/13/15 1112    Visit Number 9   Number of Visits 10   Date for PT Re-Evaluation 10/19/15   PT Start Time 1107   PT Stop Time 1150   PT Time Calculation (min) 43 min   Activity Tolerance Patient tolerated treatment well   Behavior During Therapy [facility] for tasks assessed/performed      No past medical history on file.  No past surgical history on file.  There were no vitals filed for this visit.  Visit Diagnosis:  Shoulder stiffness, left  Pain in joint of left shoulder      Subjective Assessment - 10/13/15 1110    Subjective Patient reporting only very dull pain, "not enough to count".   Currently in Pain? No/denies           TODAY'S TREATMENT  TherEx UBE 90"/90" level 2.0  TherEx Hooklying on 1/2 foam roll:  Chest/pec stretch with arms in horiz Abd x1'  B Shoulder Horiz ABD with red TB 2x10, 3" hold  B Shoulder flexion Pullover 4# x15  B shoulder ER with red TB 10x3"  L shoulder D2 Flexion 15x3"    L serratus punch 5# x10   L shoulder circles CW/CCW 5# x10 Prone  B Scapular retraction + Shoulder extension to neutral x10  L Shoulder Row x10  B "T" x10  L "Y" x10         PT Education - 10/13/15 1209    Education provided Yes   Education Details Comprehensive HEP   Person(s) Educated Patient   Methods Explanation;Demonstration;Handout   Comprehension Verbalized understanding;Returned demonstration          PT Short Term Goals - 09/16/15 1603    PT SHORT TERM GOAL #1   Title Pt will be independent with initial HEP    Status Achieved           PT Long Term Goals -  10/09/15 1228    PT LONG TERM GOAL #1   Title pt will be independent with final HEP by 10/19/15   Status On-going   PT LONG TERM GOAL #2   Title pt will improve passive shoulder Abduction and ER to [facility] without increased pain by 10/19/15   Status On-going   PT LONG TERM GOAL #3   Title pt will report being able to don bra without restrictions by 10/19/15   Status On-going   PT LONG TERM GOAL #4   Title pt will improve L shoulder strength to at least 4/5 by 10/19/15   Status On-going               Plan - 10/13/15 1156    Clinical Impression Statement Reviewed exercises from current program which would be relevant for patient to continue on own as part of HEP and provided handout. Encouraged patient to attempt all exercises at least once prior to next visit to identify any issues with exercises at home. If good compliance with HEP and continued deficits noted with assessment at next visit, may consider recert.   PT Next Visit Plan Assess ROM/MMT/Goal status and compliance with HEP in prep for D/C vs Recert;  L shoulder ROM, scapular stabilization, manual work to include inferior glides and PROM PRN, scapular mobs PRN   Consulted and Agree with Plan of Care Patient        Problem List Patient Active Problem List   Diagnosis Date Noted  . HEMORRHAGE OF RECTUM AND ANUS 06/17/2008    Percival Spanish, PT, MPT 10/13/2015, 12:09 PM  [facility] [address]  [place], [place], [address] Phone: [phone]   Fax:  [phone]  Name: Brooke Snyder MRN: [id number] Date of Birth: Feb 09, 1961

## 2015-10-16 ENCOUNTER — Ambulatory Visit: Payer: BLUE CROSS/BLUE SHIELD | Admitting: Physical Therapy

## 2015-10-16 DIAGNOSIS — M25512 Pain in left shoulder: Secondary | ICD-10-CM

## 2015-10-16 DIAGNOSIS — M25612 Stiffness of left shoulder, not elsewhere classified: Secondary | ICD-10-CM | POA: Diagnosis not present

## 2015-10-16 NOTE — Therapy (Addendum)
[facility] [address]  [place], [place], [address] Phone: [phone]   Fax:  [phone]  Physical Therapy Treatment  Patient Details  Name: Brooke Snyder MRN: [id number] Date of Birth: [birth year] Referring Provider: Jenetta Loges  Encounter Date: 10/16/2015      PT End of Session - 10/16/15 1108    Visit Number 10   Number of Visits 10   Date for PT Re-Evaluation 10/19/15   PT Start Time 1105   PT Stop Time 1145   PT Time Calculation (min) 40 min   Activity Tolerance Patient tolerated treatment well   Behavior During Therapy [facility] for tasks assessed/performed      No past medical history on file.  No past surgical history on file.  There were no vitals filed for this visit.  Visit Diagnosis:  Shoulder stiffness, left  Pain in joint of left shoulder      Subjective Assessment - 10/16/15 1106    Subjective Patient reports she has had a bad week and was not able to try the updates to her HEP.   Currently in Pain? Yes   Pain Score 1    Pain Location Shoulder   Pain Orientation Left   Pain Descriptors / Indicators Sore            [facility] PT Assessment - 10/16/15 1105    Observation/Other Assessments   Focus on Therapeutic Outcomes (FOTO)  Shoulder - 67% (33% limited)   ROM / Strength   AROM / PROM / Strength AROM;Strength   AROM   AROM Assessment Site Shoulder   Right/Left Shoulder Left   Left Shoulder Flexion 158 Degrees   Left Shoulder ABduction 160 Degrees   Left Shoulder Internal Rotation --  T10 (compared to T8 on R)   Left Shoulder External Rotation --  90%   Strength   Strength Assessment Site Shoulder   Right/Left Shoulder Left   Left Shoulder Flexion --  5-/5   Left Shoulder Extension 4+/5   Left Shoulder ABduction 4/5   Left Shoulder Internal Rotation 4+/5   Left Shoulder External Rotation 4/5         TODAY'S TREATMENT  TherEx UBE 90"/90" level  2.0  TherEx Hooklying on 1/2 foam roll:  Chest/pec stretch with arms in horiz Abd x1'  B Shoulder Horiz ABD with Green TB 2x10, 3" hold  B shoulder ER with red TB 10x3"  L shoulder D2 Flexion with Red TB 15x3"   B Shoulder flexion Pullover 5# x15   L serratus punch 5# x10  L shoulder circles CW/CCW 5# x10 Prone  B Scapular retraction + Shoulder extension to neutral x10  L Shoulder Row x10  B "T" x10  L "Y" x10         PT Short Term Goals - 09/16/15 1603    PT SHORT TERM GOAL #1   Title Pt will be independent with initial HEP    Status Achieved           PT Long Term Goals - 10/16/15 1112    PT LONG TERM GOAL #1   Title pt will be independent with final HEP by 10/19/15   Status Partially Met  Patient has performed all exercsies appropriately during therapy session, but reports limited compliance with HEP   PT LONG TERM GOAL #2   Title pt will improve passive shoulder Abduction and ER to [facility] without increased pain by 10/19/15   Status Achieved  PT LONG TERM GOAL #3   Title  pt will report being able to don bra without restrictions by  10/19/15   Status Not Met   PT LONG TERM GOAL #4   Title pt will improve L shoulder strength to at least 4/5 by 10/19/15   Status Achieved                Plan - 10/16/15 1136    Clinical Impression Statement Patient noncompliant with completion of HEP since last visit, therefore unable to identify if any issues or concerns exist with HEP at home. Patient able to demonstrate all exercises appropriately during therapy session and provided with green TB for continued progression of resistance at home. Patient has overall demonstrated good progress with PT with restoration of functional ROM in all planes of left shoulder motion with only slight restriction in IR creating continued difficulty with donning bra. Part of continued restriction most likely stems form poor compliance with home stretching and HEP.  All goals met  except compliance with HEP and functional IR for donning bra. At this time will place patient on hold for 30 days with patient to call for appointment to return only if compliant with HEP and still noticing a change in function, with patient cautioned that returning to therapy due to lack of compliance with HEP is not an option. If no need to return within 30 days, will proceed with discharge from PT.   PT Next Visit Plan 30 day hold   Consulted and Agree with Plan of Care Patient        Problem List Patient Active Problem List   Diagnosis Date Noted  . HEMORRHAGE OF RECTUM AND ANUS 06/17/2008    Percival Spanish, PT, MPT 10/16/2015, 11:58 AM  [facility] [address]  [place], [place], [address] Phone: [phone]   Fax:  [phone]  Name: Brooke Snyder MRN: [id number] Date of Birth: 01-07-1961   PHYSICAL THERAPY DISCHARGE SUMMARY  Visits from Start of Care: 10  Current functional level related to goals / functional outcomes:  Patient demonstrated good progress with PT with restoration of functional ROM in all planes of left shoulder motion with only slight restriction in IR creating continued difficulty with donning bra. Part of continued restriction most likely stems form poor compliance with home stretching and HEP.As of last visit, all goals were met except compliance with HEP and functional IR for donning bra. Pt was placed on hold for 30 days with patient to call for appointment to return only if compliant with HEP and still noticing a change in function, with patient cautioned that returning to therapy due to lack of compliance with HEP is not an option. Pt has not returned to PT within 30 days, therefore will proceed with discharge from PT.  Remaining deficits:  Limited functional IR for donning bra   Education / Equipment:  HEP Plan: Patient agrees to discharge.  Patient goals were partially met.  Patient is being discharged due to being pleased with the current functional level.  ?????       Percival Spanish, PT, MPT 11/19/2015, 9:25 AM  [facility] [address]  [place], [place], [address] Phone: [phone]   Fax:  [phone]

## 2016-04-11 NOTE — Patient Instructions (Signed)
Your procedure is scheduled on:  Monday, April 18, 2016  Enter through the Main Entrance of [facility] at:  6:00 AM  Pick up the phone at the desk and dial [phone].  Call this number if you have problems the morning of surgery: [phone].  Remember: Do NOT eat food or drink after:  Midnight Sunday  Take these medicines the morning of surgery with a SIP OF WATER: None  Do NOT wear jewelry (body piercing), metal hair clips/bobby pins, make-up, or nail polish. Do NOT wear lotions, powders, or perfumes.  You may wear deodorant. Do NOT shave for 48 hours prior to surgery. Do NOT bring valuables to the hospital. Contacts, dentures, or bridgework may not be worn into surgery.  Have a responsible adult drive you home and stay with you for 24 hours after your procedure

## 2016-04-12 ENCOUNTER — Inpatient Hospital Stay (HOSPITAL_COMMUNITY)
Admission: RE | Admit: 2016-04-12 | Discharge: 2016-04-12 | Disposition: A | Discharge status: Home / Self Care | Payer: BLUE CROSS/BLUE SHIELD | Source: Ambulatory Visit

## 2016-04-18 ENCOUNTER — Encounter (HOSPITAL_COMMUNITY): Admission: RE | Payer: Self-pay | Source: Ambulatory Visit

## 2016-04-18 ENCOUNTER — Ambulatory Visit (HOSPITAL_COMMUNITY)
Admission: RE | Admit: 2016-04-18 | Payer: BLUE CROSS/BLUE SHIELD | Source: Ambulatory Visit | Admitting: Obstetrics and Gynecology

## 2016-04-18 SURGERY — DILATATION & CURETTAGE/HYSTEROSCOPY WITH MYOSURE
Anesthesia: Choice

## 2017-08-05 ENCOUNTER — Other Ambulatory Visit: Payer: Self-pay | Admitting: Obstetrics and Gynecology

## 2017-08-05 DIAGNOSIS — R928 Other abnormal and inconclusive findings on diagnostic imaging of breast: Secondary | ICD-10-CM

## 2017-08-09 ENCOUNTER — Other Ambulatory Visit: Payer: Self-pay | Admitting: Obstetrics and Gynecology

## 2017-08-09 DIAGNOSIS — R928 Other abnormal and inconclusive findings on diagnostic imaging of breast: Secondary | ICD-10-CM

## 2017-08-15 ENCOUNTER — Other Ambulatory Visit: Payer: BLUE CROSS/BLUE SHIELD

## 2017-08-31 ENCOUNTER — Ambulatory Visit
Admission: RE | Admit: 2017-08-31 | Discharge: 2017-08-31 | Disposition: A | Discharge status: Home / Self Care | Payer: BLUE CROSS/BLUE SHIELD | Source: Ambulatory Visit | Attending: Obstetrics and Gynecology | Admitting: Obstetrics and Gynecology

## 2017-08-31 DIAGNOSIS — R928 Other abnormal and inconclusive findings on diagnostic imaging of breast: Secondary | ICD-10-CM

## 2018-04-15 ENCOUNTER — Encounter: Payer: Self-pay | Admitting: Gastroenterology

## 2018-06-25 ENCOUNTER — Encounter: Payer: Self-pay | Admitting: Gastroenterology

## 2018-08-03 ENCOUNTER — Ambulatory Visit (AMBULATORY_SURGERY_CENTER): Payer: Self-pay

## 2018-08-03 ENCOUNTER — Encounter: Payer: Self-pay | Admitting: Gastroenterology

## 2018-08-03 VITALS — Ht 69.0 in | Wt 175.4 lb

## 2018-08-03 DIAGNOSIS — Z1211 Encounter for screening for malignant neoplasm of colon: Secondary | ICD-10-CM

## 2018-08-03 MED ORDER — NA SULFATE-K SULFATE-MG SULF 17.5-3.13-1.6 GM/177ML PO SOLN: 1.0000 | Freq: Once | ORAL | 0 refills | Status: AC

## 2018-08-03 NOTE — Progress Notes (Signed)
Denies allergies to eggs or soy products. Denies complication of anesthesia or sedation. Denies use of weight loss medication. Denies use of O2.   Emmi instructions declined.    A pay no more than 50.00 coupon was given to the patient.  

## 2018-08-17 ENCOUNTER — Encounter: Payer: Self-pay | Admitting: Gastroenterology

## 2018-08-17 ENCOUNTER — Ambulatory Visit (AMBULATORY_SURGERY_CENTER): Payer: BLUE CROSS/BLUE SHIELD | Admitting: Gastroenterology

## 2018-08-17 ENCOUNTER — Other Ambulatory Visit: Payer: Self-pay | Admitting: Obstetrics and Gynecology

## 2018-08-17 VITALS — BP 101/66 | HR 56 | Temp 97.1°F | Resp 10 | Ht 69.0 in | Wt 175.0 lb

## 2018-08-17 DIAGNOSIS — D123 Benign neoplasm of transverse colon: Secondary | ICD-10-CM | POA: Diagnosis not present

## 2018-08-17 DIAGNOSIS — D125 Benign neoplasm of sigmoid colon: Secondary | ICD-10-CM | POA: Diagnosis not present

## 2018-08-17 DIAGNOSIS — Z1211 Encounter for screening for malignant neoplasm of colon: Secondary | ICD-10-CM | POA: Diagnosis not present

## 2018-08-17 DIAGNOSIS — D120 Benign neoplasm of cecum: Secondary | ICD-10-CM

## 2018-08-17 DIAGNOSIS — N6320 Unspecified lump in the left breast, unspecified quadrant: Secondary | ICD-10-CM

## 2018-08-17 MED ORDER — SODIUM CHLORIDE 0.9 % IV SOLN: 500.0000 mL | Freq: Once | INTRAVENOUS | Status: AC

## 2018-08-17 NOTE — Patient Instructions (Signed)
YOU HAD AN ENDOSCOPIC PROCEDURE TODAY AT THE [facility]:   Refer to the procedure report that was given to you for any specific questions about what was found during the examination.  If the procedure report does not answer your questions, please call your gastroenterologist to clarify.  If you requested that your care partner not be given the details of your procedure findings, then the procedure report has been included in a sealed envelope for you to review at your convenience later.  YOU SHOULD EXPECT: Some feelings of bloating in the abdomen. Passage of more gas than usual.  Walking can help get rid of the air that was put into your GI tract during the procedure and reduce the bloating. If you had a lower endoscopy (such as a colonoscopy or flexible sigmoidoscopy) you may notice spotting of blood in your stool or on the toilet paper. If you underwent a bowel prep for your procedure, you may not have a normal bowel movement for a few days.  Please Note:  You might notice some irritation and congestion in your nose or some drainage.  This is from the oxygen used during your procedure.  There is no need for concern and it should clear up in a day or so.  SYMPTOMS TO REPORT IMMEDIATELY:   Following lower endoscopy (colonoscopy or flexible sigmoidoscopy):  Excessive amounts of blood in the stool  Significant tenderness or worsening of abdominal pains  Swelling of the abdomen that is new, acute  Fever of 100F or higher  For urgent or emergent issues, a gastroenterologist can be reached at any hour by calling [phone].   DIET:  We do recommend a small meal at first, but then you may proceed to your regular diet.  Drink plenty of fluids but you should avoid alcoholic beverages for 24 hours.  ACTIVITY:  You should plan to take it easy for the rest of today and you should NOT DRIVE or use heavy machinery until tomorrow (because of the sedation medicines used during the test).     FOLLOW UP: Our staff will call the number listed on your records the next business day following your procedure to check on you and address any questions or concerns that you may have regarding the information given to you following your procedure. If we do not reach you, we will leave a message.  However, if you are feeling well and you are not experiencing any problems, there is no need to return our call.  We will assume that you have returned to your regular daily activities without incident.  If any biopsies were taken you will be contacted by phone or by letter within the next 1-3 weeks.  Please call us at [phone] if you have not heard about the biopsies in 3 weeks.    SIGNATURES/CONFIDENTIALITY: You and/or your care partner have signed paperwork which will be entered into your electronic medical record.  These signatures attest to the fact that that the information above on your After Visit Summary has been reviewed and is understood.  Full responsibility of the confidentiality of this discharge information lies with you and/or your care-partner. 

## 2018-08-17 NOTE — Op Note (Signed)
[facility] Patient Name: Brooke Snyder Procedure Date: 08/17/2018 8:15 AM MRN: [id number] Endoscopist: Jackquline Denmark , MD Age: 57 Referring MD:  Date of Birth: Jun 04, 1961 Gender: Female Account #: [id number] Procedure:                Colonoscopy Indications:              Screening for colorectal malignant neoplasm Medicines:                Monitored Anesthesia Care Procedure:                Pre-Anesthesia Assessment:                           - Prior to the procedure, a History and Physical                            was performed, and patient medications and                            allergies were reviewed. The patient's tolerance of                            previous anesthesia was also reviewed. The risks                            and benefits of the procedure and the sedation                            options and risks were discussed with the patient.                            All questions were answered, and informed consent                            was obtained. Prior Anticoagulants: The patient has                            taken no previous anticoagulant or antiplatelet                            agents. ASA Grade Assessment: I - A normal, healthy                            patient. After reviewing the risks and benefits,                            the patient was deemed in satisfactory condition to                            undergo the procedure.                           After obtaining informed consent, the colonoscope  was passed under direct vision. Throughout the                            procedure, the patient's blood pressure, pulse, and                            oxygen saturations were monitored continuously. The                            Model PCF-[id number]) scope was introduced                            through the anus and advanced to the 2 cm into the                            ileum. The colonoscopy  was performed without                            difficulty. The patient tolerated the procedure                            well. The quality of the bowel preparation was                            excellent. Scope In: 8:24:43 AM Scope Out: 8:43:09 AM Scope Withdrawal Time: 0 hours 13 minutes 44 seconds  Total Procedure Duration: 0 hours 18 minutes 26 seconds  Findings:                 A 2 mm polyp was found in the cecum. The polyp was                            sessile. The polyp was removed with a cold biopsy                            forceps. Resection and retrieval were complete.                            Estimated blood loss: none.                           Two sessile polyps were found in the mid sigmoid                            colon and proximal transverse colon. The polyps                            were 6 mm in size. These polyps were removed with a                            cold snare. Resection and retrieval were complete.                           A  few small-mouthed diverticula were found in the                            sigmoid colon and ascending colon.                           Non-bleeding internal hemorrhoids were found during                            retroflexion. The hemorrhoids were small.                           The terminal ileum appeared normal.                           The exam was otherwise without abnormality on                            direct and retroflexion views. Complications:            No immediate complications. Estimated Blood Loss:     Estimated blood loss: none. Impression:               -Colonic polyps status post polypectomy.                           -Mild pancolonic diverticulosis.                           -Small internal hemorrhoids.                           -Otherwise normal colonoscopy to TI. Recommendation:           - Patient has a contact number available for                            emergencies. The signs and symptoms of  potential                            delayed complications were discussed with the                            patient. Return to normal activities tomorrow.                            Written discharge instructions were provided to the                            patient.                           - Resume previous diet.                           - Continue present medications.                           -  Await pathology results.                           - Repeat colonoscopy for surveillance based on                            pathology results.                           - Return to GI clinic PRN. Jackquline Denmark, MD 08/17/2018 8:49:48 AM This report has been signed electronically.

## 2018-08-17 NOTE — Progress Notes (Signed)
Report given to PACU, vss 

## 2018-08-17 NOTE — Progress Notes (Signed)
Called to room to assist during endoscopic procedure.  Patient ID and intended procedure confirmed with present staff. Received instructions for my participation in the procedure from the performing physician.  

## 2018-08-20 ENCOUNTER — Telehealth: Payer: Self-pay

## 2018-08-20 ENCOUNTER — Telehealth: Payer: Self-pay | Admitting: *Deleted

## 2018-08-20 ENCOUNTER — Telehealth: Payer: Self-pay | Admitting: Gastroenterology

## 2018-08-20 NOTE — Telephone Encounter (Signed)
  Follow up Call-  Call back number 08/17/2018  Post procedure Call Back phone  # [phone]  Permission to leave phone message Yes  Some recent data might be hidden    [facility]

## 2018-08-20 NOTE — Telephone Encounter (Signed)
Left message on answering machine. 

## 2018-08-20 NOTE — Telephone Encounter (Signed)
Called and spoke with patient- patient is requesting to know "Is there something that I really should not eat-Is there a herbal supplement out there that I can take for preventing diverticulitis"?; patient was informed of increasing fluids, eating a high fiber diet, and avoiding "trigger foods"; patient was also informed that a few handouts about diverticulosis and a high fiber diet were mailed to patient; patient verbalized understanding of information/instructions; Patient was advised to call back if questions/concerns arise;

## 2018-08-23 ENCOUNTER — Encounter: Payer: Self-pay | Admitting: Gastroenterology

## 2018-09-04 ENCOUNTER — Ambulatory Visit
Admission: RE | Admit: 2018-09-04 | Discharge: 2018-09-04 | Disposition: A | Discharge status: Home / Self Care | Payer: BLUE CROSS/BLUE SHIELD | Source: Ambulatory Visit | Attending: Obstetrics and Gynecology | Admitting: Obstetrics and Gynecology

## 2018-09-04 ENCOUNTER — Other Ambulatory Visit: Payer: Self-pay | Admitting: Obstetrics and Gynecology

## 2018-09-04 DIAGNOSIS — N6320 Unspecified lump in the left breast, unspecified quadrant: Secondary | ICD-10-CM

## 2018-09-10 ENCOUNTER — Ambulatory Visit
Admission: RE | Admit: 2018-09-10 | Discharge: 2018-09-10 | Disposition: A | Discharge status: Home / Self Care | Payer: BLUE CROSS/BLUE SHIELD | Source: Ambulatory Visit | Attending: Obstetrics and Gynecology | Admitting: Obstetrics and Gynecology

## 2018-09-10 ENCOUNTER — Other Ambulatory Visit: Payer: Self-pay | Admitting: Obstetrics and Gynecology

## 2018-09-10 DIAGNOSIS — N6320 Unspecified lump in the left breast, unspecified quadrant: Secondary | ICD-10-CM

## 2018-09-10 HISTORY — PX: BREAST BIOPSY: SHX20

## 2021-09-08 DIAGNOSIS — Z130 Encounter for screening for diseases of the blood and blood-forming organs and certain disorders involving the immune mechanism: Secondary | ICD-10-CM | POA: Diagnosis not present

## 2021-09-08 DIAGNOSIS — Z01419 Encounter for gynecological examination (general) (routine) without abnormal findings: Secondary | ICD-10-CM | POA: Diagnosis not present

## 2021-09-08 DIAGNOSIS — Z6830 Body mass index (BMI) 30.0-30.9, adult: Secondary | ICD-10-CM | POA: Diagnosis not present

## 2021-09-08 DIAGNOSIS — Z1389 Encounter for screening for other disorder: Secondary | ICD-10-CM | POA: Diagnosis not present

## 2021-11-12 ENCOUNTER — Other Ambulatory Visit: Payer: Self-pay | Admitting: Obstetrics and Gynecology

## 2021-11-12 DIAGNOSIS — Z1231 Encounter for screening mammogram for malignant neoplasm of breast: Secondary | ICD-10-CM

## 2021-11-16 ENCOUNTER — Ambulatory Visit: Payer: BLUE CROSS/BLUE SHIELD

## 2021-11-17 ENCOUNTER — Ambulatory Visit
Admission: RE | Admit: 2021-11-17 | Discharge: 2021-11-17 | Disposition: A | Discharge status: Home / Self Care | Payer: BC Managed Care – PPO | Source: Ambulatory Visit | Attending: Obstetrics and Gynecology | Admitting: Obstetrics and Gynecology

## 2021-11-17 DIAGNOSIS — Z1231 Encounter for screening mammogram for malignant neoplasm of breast: Secondary | ICD-10-CM | POA: Diagnosis not present

## 2021-12-28 DIAGNOSIS — D225 Melanocytic nevi of trunk: Secondary | ICD-10-CM | POA: Diagnosis not present

## 2021-12-28 DIAGNOSIS — L814 Other melanin hyperpigmentation: Secondary | ICD-10-CM | POA: Diagnosis not present

## 2021-12-28 DIAGNOSIS — L821 Other seborrheic keratosis: Secondary | ICD-10-CM | POA: Diagnosis not present

## 2021-12-28 DIAGNOSIS — R202 Paresthesia of skin: Secondary | ICD-10-CM | POA: Diagnosis not present

## 2022-06-08 DIAGNOSIS — S99921A Unspecified injury of right foot, initial encounter: Secondary | ICD-10-CM | POA: Diagnosis not present

## 2022-06-16 DIAGNOSIS — H01004 Unspecified blepharitis left upper eyelid: Secondary | ICD-10-CM | POA: Diagnosis not present

## 2022-07-13 DIAGNOSIS — S99921A Unspecified injury of right foot, initial encounter: Secondary | ICD-10-CM | POA: Diagnosis not present

## 2022-07-27 DIAGNOSIS — Z03818 Encounter for observation for suspected exposure to other biological agents ruled out: Secondary | ICD-10-CM | POA: Diagnosis not present

## 2022-07-27 DIAGNOSIS — R6883 Chills (without fever): Secondary | ICD-10-CM | POA: Diagnosis not present

## 2022-07-27 DIAGNOSIS — H66003 Acute suppurative otitis media without spontaneous rupture of ear drum, bilateral: Secondary | ICD-10-CM | POA: Diagnosis not present

## 2022-07-27 DIAGNOSIS — J019 Acute sinusitis, unspecified: Secondary | ICD-10-CM | POA: Diagnosis not present

## 2022-07-27 DIAGNOSIS — R059 Cough, unspecified: Secondary | ICD-10-CM | POA: Diagnosis not present

## 2022-07-27 DIAGNOSIS — R0981 Nasal congestion: Secondary | ICD-10-CM | POA: Diagnosis not present

## 2022-08-19 DIAGNOSIS — S99921A Unspecified injury of right foot, initial encounter: Secondary | ICD-10-CM | POA: Diagnosis not present

## 2022-09-28 DIAGNOSIS — M792 Neuralgia and neuritis, unspecified: Secondary | ICD-10-CM | POA: Diagnosis not present

## 2022-10-17 ENCOUNTER — Other Ambulatory Visit: Payer: Self-pay | Admitting: Family Medicine

## 2022-10-17 DIAGNOSIS — Z1231 Encounter for screening mammogram for malignant neoplasm of breast: Secondary | ICD-10-CM

## 2022-11-30 ENCOUNTER — Ambulatory Visit
Admission: RE | Admit: 2022-11-30 | Discharge: 2022-11-30 | Disposition: A | Discharge status: Home / Self Care | Payer: BC Managed Care – PPO | Source: Ambulatory Visit | Attending: Family Medicine | Admitting: Family Medicine

## 2022-11-30 DIAGNOSIS — Z1231 Encounter for screening mammogram for malignant neoplasm of breast: Secondary | ICD-10-CM

## 2023-01-18 DIAGNOSIS — L814 Other melanin hyperpigmentation: Secondary | ICD-10-CM | POA: Diagnosis not present

## 2023-01-18 DIAGNOSIS — L821 Other seborrheic keratosis: Secondary | ICD-10-CM | POA: Diagnosis not present

## 2023-01-18 DIAGNOSIS — D225 Melanocytic nevi of trunk: Secondary | ICD-10-CM | POA: Diagnosis not present

## 2023-01-18 DIAGNOSIS — L720 Epidermal cyst: Secondary | ICD-10-CM | POA: Diagnosis not present

## 2023-01-18 DIAGNOSIS — D492 Neoplasm of unspecified behavior of bone, soft tissue, and skin: Secondary | ICD-10-CM | POA: Diagnosis not present

## 2023-01-18 DIAGNOSIS — L820 Inflamed seborrheic keratosis: Secondary | ICD-10-CM | POA: Diagnosis not present

## 2023-01-18 DIAGNOSIS — L538 Other specified erythematous conditions: Secondary | ICD-10-CM | POA: Diagnosis not present

## 2023-01-18 DIAGNOSIS — R58 Hemorrhage, not elsewhere classified: Secondary | ICD-10-CM | POA: Diagnosis not present

## 2023-10-24 ENCOUNTER — Encounter: Payer: Self-pay | Admitting: Gastroenterology

## 2024-01-26 ENCOUNTER — Other Ambulatory Visit: Payer: Self-pay | Admitting: Family Medicine

## 2024-01-26 DIAGNOSIS — Z1231 Encounter for screening mammogram for malignant neoplasm of breast: Secondary | ICD-10-CM

## 2024-02-07 ENCOUNTER — Ambulatory Visit
Admission: RE | Admit: 2024-02-07 | Discharge: 2024-02-07 | Disposition: A | Discharge status: Home / Self Care | Source: Ambulatory Visit | Attending: Family Medicine | Admitting: Family Medicine

## 2024-02-07 DIAGNOSIS — Z1231 Encounter for screening mammogram for malignant neoplasm of breast: Secondary | ICD-10-CM

## 2024-02-07 DIAGNOSIS — Z1151 Encounter for screening for human papillomavirus (HPV): Secondary | ICD-10-CM | POA: Diagnosis not present

## 2024-02-07 DIAGNOSIS — Z124 Encounter for screening for malignant neoplasm of cervix: Secondary | ICD-10-CM | POA: Diagnosis not present

## 2024-02-07 DIAGNOSIS — Z01419 Encounter for gynecological examination (general) (routine) without abnormal findings: Secondary | ICD-10-CM | POA: Diagnosis not present

## 2024-03-14 DIAGNOSIS — L814 Other melanin hyperpigmentation: Secondary | ICD-10-CM | POA: Diagnosis not present

## 2024-03-14 DIAGNOSIS — L570 Actinic keratosis: Secondary | ICD-10-CM | POA: Diagnosis not present

## 2024-03-14 DIAGNOSIS — D225 Melanocytic nevi of trunk: Secondary | ICD-10-CM | POA: Diagnosis not present

## 2024-03-14 DIAGNOSIS — D485 Neoplasm of uncertain behavior of skin: Secondary | ICD-10-CM | POA: Diagnosis not present

## 2024-03-14 DIAGNOSIS — L821 Other seborrheic keratosis: Secondary | ICD-10-CM | POA: Diagnosis not present
